# Patient Record
Sex: Male | Born: 1995 | Race: White | Hispanic: No | Marital: Single | Smoking: Former smoker
Health system: Southern US, Community
[De-identification: ages and names within clinical notes are randomized; demographics above are authoritative.]

## PROBLEM LIST (undated history)

## (undated) DIAGNOSIS — R51 Headache: Secondary | ICD-10-CM

## (undated) DIAGNOSIS — S060X9A Concussion with loss of consciousness of unspecified duration, initial encounter: Secondary | ICD-10-CM

## (undated) DIAGNOSIS — S62308A Unspecified fracture of other metacarpal bone, initial encounter for closed fracture: Secondary | ICD-10-CM

## (undated) DIAGNOSIS — B2790 Infectious mononucleosis, unspecified without complication: Secondary | ICD-10-CM

## (undated) DIAGNOSIS — S060XAA Concussion with loss of consciousness status unknown, initial encounter: Secondary | ICD-10-CM

## (undated) HISTORY — DX: Concussion with loss of consciousness of unspecified duration, initial encounter: S06.0X9A

## (undated) HISTORY — DX: Infectious mononucleosis, unspecified without complication: B27.90

## (undated) HISTORY — DX: Unspecified fracture of other metacarpal bone, initial encounter for closed fracture: S62.308A

## (undated) HISTORY — DX: Concussion with loss of consciousness status unknown, initial encounter: S06.0XAA

## (undated) HISTORY — DX: Headache: R51

---
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
## 2013-02-18 ENCOUNTER — Encounter (INDEPENDENT_AMBULATORY_CARE_PROVIDER_SITE_OTHER): Payer: Self-pay

## 2013-02-18 ENCOUNTER — Encounter (INDEPENDENT_AMBULATORY_CARE_PROVIDER_SITE_OTHER): Payer: Self-pay | Admitting: General Surgery

## 2013-02-18 ENCOUNTER — Ambulatory Visit (INDEPENDENT_AMBULATORY_CARE_PROVIDER_SITE_OTHER): Payer: No Typology Code available for payment source | Admitting: General Surgery

## 2013-02-18 DIAGNOSIS — L0591 Pilonidal cyst without abscess: Secondary | ICD-10-CM | POA: Insufficient documentation

## 2013-02-18 NOTE — Progress Notes (Signed)
Patient ID: Derrick Bryant, male   DOB: 1995-12-18, 17 y.o.   MRN: [id number]  No chief complaint on file.   [name] is a 17 y.o. male.   HPI  He has a pilonidal cyst that has had intermittent recurrent infections. These have responded to nonoperative management. Because they have been recurrent, he presents to discuss pilonidal cystectomy. He is self-referred.  Past Medical History  Diagnosis Date  . Headache(784.0)   . Infectious mononucleosis June, 2006  . Closed fracture of 5th metacarpal July 2010    Left hand  . Concussion     Past Surgical History  Procedure Laterality Date  . Tonsillectomy and adenoidectomy  August 2006    Family History  Problem Relation Age of Onset  . Cancer Paternal Grandfather     Died in his 92's    Social History History  Substance Use Topics  . Smoking status: Former Games developer  . Smokeless tobacco: Never Used     Comment: Patient tried smoking a cigarette only once and has not done it again.  . Alcohol Use: Yes     Comment: Patient rarely drinks alcohol.    Allergies  Allergen Reactions  . Biaxin [Clarithromycin] Hives  . Sulfa Antibiotics Hives    Current Outpatient Prescriptions  Medication Sig Dispense Refill  . fluticasone (FLONASE) 50 MCG/ACT nasal spray        No current facility-administered medications for this visit.    Review of Systems Review of Systems  Constitutional: Negative.   Respiratory: Negative.   Cardiovascular: Negative.   Gastrointestinal: Negative.   Hematological: Negative.     There were no vitals taken for this visit.  Physical Exam Physical Exam  Constitutional: He appears well-developed and well-nourished. No distress.  HENT:  Head: Normocephalic and atraumatic.  Eyes: EOM are normal. No scleral icterus.  Cardiovascular: Normal rate and regular rhythm.   Pulmonary/Chest: Effort normal and breath sounds normal.  Abdominal: Soft. He exhibits no mass. There is no tenderness.   Musculoskeletal:  3-4 cm soft tissue mass superior gluteal cleft area. Scar is overlying this mass. There is a firm tract extending inferiorly to an open sinus. No erythema. No purulent drainage.    Data Reviewed none  Assessment    Pilonidal cyst with recurrent infections. None infected at the current time.     Plan    Pilonidal cystectomy with wound healing by secondary intention. We have discussed the procedure and the risks. Risks include but not limited to bleeding, infection, wound healing problems, anesthesia, recurrence. We discussed the length of time but to take the wound to heal. He seems to understand all this and agrees to proceed.        Derrick Bryant J 02/18/2013, 10:23 AM

## 2013-02-18 NOTE — Patient Instructions (Signed)
Will have schedulers call your house with the details of the surgery.

## 2013-03-08 ENCOUNTER — Telehealth (INDEPENDENT_AMBULATORY_CARE_PROVIDER_SITE_OTHER): Payer: Self-pay | Admitting: General Surgery

## 2013-03-08 NOTE — Telephone Encounter (Signed)
I spoke with Dr. Lequita Halt about his son Derrick Bryant who is going to have elective surgery tomorrow. He has the flu and so we have decided to cancel the surgery for now until he gets over the flu.

## 2014-09-13 ENCOUNTER — Encounter: Payer: Self-pay | Admitting: General Surgery

## 2014-09-13 NOTE — Progress Notes (Unsigned)
His father, Dr. Homero Fellers Coad called this morning and stated that Derrick Bryant had a recurrent pilonidal abscess that drained a large amount of purulent material yesterday.  He then had a fever of 102 degrees last night but is afebrile this morning.  No redness around the area.  [name] is starting summer school at college in 2 days.  Will start him on Augmentin for one week.  Will anticipation pilonidal cystectomy later this year.

## 2019-03-25 ENCOUNTER — Telehealth: Payer: Self-pay | Admitting: Family

## 2019-03-25 DIAGNOSIS — Z20822 Contact with and (suspected) exposure to covid-19: Secondary | ICD-10-CM

## 2019-03-25 MED ORDER — BENZONATATE 100 MG PO CAPS: 100.0000 mg | ORAL_CAPSULE | Freq: Three times a day (TID) | ORAL | 0 refills | Status: AC | PRN

## 2019-03-25 NOTE — Progress Notes (Signed)
E-Visit for Corona Virus Screening   Your current symptoms could be consistent with the coronavirus.  Many health care providers can now test patients at their office but not all are.  [facility] has multiple testing sites. For information on our COVID testing locations and hours go to [email or web address]  We are enrolling you in our MyChart Home Monitoring for COVID19 . Daily you will receive a questionnaire within the MyChart website. Our COVID 19 response team will be monitoring your responses daily.  Testing Information: The COVID-19 Community Testing sites will begin testing BY APPOINTMENT ONLY.  You can schedule online at [email or web address]  If you do not have access to a smart phone or computer you may call [phone] for an appointment.  Testing Locations: Appointment schedule is 8 am to 3:30 pm at all sites  [facility] indoors at [address], [place] [address] [facility]  indoors at [address] Vis[address]trance, [place], [place] [address] [place] indoors at [address], [place] [address]  Additional testing sites in the Community:  . For [facility] Testing sites in [place]  https://www.cvs.com/minuteclinic/covid-19-testing  . For Pop-up testing sites in [place]  https://covid19.ncdhhs.gov/about-covid-19/testing/find-my-testing-place/pop-testing-sites  . For Testing sites with regular hours https://onsms.org/[place]/  . For [facility] [place] [facility] https://[facility].com/covid-19-community-outreach-testing/  . For [facility] https://www.[place].gov[place]/human-services/health-department/coronavirus-covid-19-info/covid-19-testing  . For [place] testing in [place] and [place] https://www.[place].gov/[place]/human-services/health-department/coronavirus-covid-19-info/covid-19-testing  . For [facility] testing in [place]   https://[facility]hi.care/covidtesting  For  more  information about community testing call [phone]   We are enrolling you in our MyChart Home Monitoring for COVID19 . Daily you will receive a questionnaire within the MyChart website. Our COVID 19 response team will be monitoring your responses daily.  Please quarantine yourself while awaiting your test results. If you develop fever/cough/breathlessness, please stay home for 10 days with improving symptoms and until you have had 24 hours of no fever (without taking a fever reducer).  You should wear a mask or cloth face covering over your nose and mouth if you must be around other people or animals, including pets (even at home). Try to stay at least 6 feet away from other people. This will protect the people around you.  Please continue good preventive care measures, including:  frequent hand-washing, avoid touching your face, cover coughs/sneezes, stay out of crowds and keep a 6 foot distance from others.  COVID-19 is a respiratory illness with symptoms that are similar to the flu. Symptoms are typically mild to moderate, but there have been cases of severe illness and death due to the virus.   The following symptoms may appear 2-14 days after exposure: . Fever . Cough . Shortness of breath or difficulty breathing . Chills . Repeated shaking with chills . Muscle pain . Headache . Sore throat . New loss of taste or smell . Fatigue . Congestion or runny nose . Nausea or vomiting . Diarrhea  Go to the nearest hospital ED for assessment if fever/cough/breathlessness are severe or illness seems like a threat to life.  It is vitally important that if you feel that you have an infection such as this virus or any other virus that you stay home and away from places where you may spread it to others.  You should avoid contact with people age 65 and older.   You can use medication such as A prescription cough medication called Tessalon Perles 100 mg. You may take 1-2 capsules every 8 hours as    needed for cough.  You may also take acetaminophen (Tylenol) as needed for fever.  Reduce your risk of any infection by using the same precautions used for avoiding the common cold or flu:  . Wash your hands often with soap and warm water for at least 20 seconds.  If soap and water are not readily available, use an alcohol-based hand sanitizer with at least 60% alcohol.  . If coughing or sneezing, cover your mouth and nose by coughing or sneezing into the elbow areas of your shirt or coat, into a tissue or into your sleeve (not your hands). . Avoid shaking hands with others and consider head nods or verbal greetings only. . Avoid touching your eyes, nose, or mouth with unwashed hands.  . Avoid close contact with people who are sick. . Avoid places or events with large numbers of people in one location, like concerts or sporting events. . Carefully consider travel plans you have or are making. . If you are planning any travel outside or inside the [place], visit the CDC's Travelers' Health webpage for the latest health notices. . If you have some symptoms but not all symptoms, continue to monitor at home and seek medical attention if your symptoms worsen. . If you are having a medical emergency, call 911.  HOME CARE . Only take medications as instructed by your medical team. . Drink plenty of fluids and get plenty of rest. . A steam or ultrasonic humidifier can help if you have congestion.   GET HELP RIGHT AWAY IF YOU HAVE EMERGENCY WARNING SIGNS** FOR COVID-19. If you or someone is showing any of these signs seek emergency medical care immediately. Call 911 or proceed to your closest emergency facility if: . You develop worsening high fever. . Trouble breathing . Bluish lips or face . Persistent pain or pressure in the chest . New confusion . Inability to wake or stay awake . You cough up blood. . Your symptoms become more severe  **This list is not all possible symptoms. Contact your  medical provider for any symptoms that are sever or concerning to you.  MAKE SURE YOU   Understand these instructions.  Will watch your condition.  Will get help right away if you are not doing well or get worse.  Your e-visit answers were reviewed by a board certified advanced clinical practitioner to complete your personal care plan.  Depending on the condition, your plan could have included both over the counter or prescription medications.  If there is a problem please reply once you have received a response from your provider.  Your safety is important to us.  If you have drug allergies check your prescription carefully.    You can use MyChart to ask questions about today's visit, request a non-urgent call back, or ask for a work or school excuse for 24 hours related to this e-Visit. If it has been greater than 24 hours you will need to follow up with your provider, or enter a new e-Visit to address those concerns. You will get an e-mail in the next two days asking about your experience.  I hope that your e-visit has been valuable and will speed your recovery. Thank you for using e-visits.  Approximately 5 minutes was spent documenting and reviewing patient's chart.    

## 2019-03-26 ENCOUNTER — Other Ambulatory Visit: Payer: Self-pay

## 2019-03-26 ENCOUNTER — Encounter (HOSPITAL_COMMUNITY): Payer: Self-pay

## 2019-03-26 ENCOUNTER — Ambulatory Visit (HOSPITAL_COMMUNITY)
Admission: EM | Admit: 2019-03-26 | Discharge: 2019-03-26 | Disposition: A | Discharge status: Home / Self Care | Payer: BC Managed Care – PPO | Attending: Family Medicine | Admitting: Family Medicine

## 2019-03-26 DIAGNOSIS — U071 COVID-19: Secondary | ICD-10-CM | POA: Insufficient documentation

## 2019-03-26 DIAGNOSIS — Z20828 Contact with and (suspected) exposure to other viral communicable diseases: Secondary | ICD-10-CM | POA: Diagnosis present

## 2019-03-26 DIAGNOSIS — R52 Pain, unspecified: Secondary | ICD-10-CM

## 2019-03-26 DIAGNOSIS — Z20822 Contact with and (suspected) exposure to covid-19: Secondary | ICD-10-CM

## 2019-03-26 DIAGNOSIS — J029 Acute pharyngitis, unspecified: Secondary | ICD-10-CM

## 2019-03-26 DIAGNOSIS — B349 Viral infection, unspecified: Secondary | ICD-10-CM | POA: Diagnosis present

## 2019-03-26 DIAGNOSIS — R5383 Other fatigue: Secondary | ICD-10-CM

## 2019-03-26 LAB — POCT RAPID STREP A: Streptococcus, Group A Screen (Direct): NEGATIVE

## 2019-03-26 MED ORDER — CETIRIZINE HCL 10 MG PO CAPS: 10.0000 mg | ORAL_CAPSULE | Freq: Every day | ORAL | 0 refills | Status: AC

## 2019-03-26 NOTE — ED Triage Notes (Addendum)
Pt. States he has has body aches, sore throat, fatigue for 2 days now. Pt. Informed me of having an evisit & already been prescribed meds but since wants strep/COVID testing today.

## 2019-03-26 NOTE — Discharge Instructions (Signed)
Sore Throat  Your rapid strep tested Negative today.  Covid pending.  Please continue to quarantine until results return.  If positive quarantine until 10 days from symptom onset, symptoms improving and fever free.  Please continue Tylenol or Ibuprofen for fever and pain. May try salt water gargles, cepacol lozenges, throat spray, or OTC cold relief medicine for throat discomfort. If you also have congestion take a daily anti-histamine like Zyrtec, Claritin, and a oral decongestant to help with post nasal drip that may be irritating your throat.   Stay hydrated and drink plenty of fluids to keep your throat coated relieve irritation.

## 2019-03-27 LAB — NOVEL CORONAVIRUS, NAA (HOSP ORDER, SEND-OUT TO REF LAB; TAT 18-24 HRS): SARS-CoV-2, NAA: DETECTED — AB

## 2019-03-28 LAB — CULTURE, GROUP A STREP (THRC)

## 2019-03-28 NOTE — ED Provider Notes (Signed)
[facility]    CSN: [id number] Arrival date & time: 03/26/19  1617      History   Chief Complaint Chief Complaint  Patient presents with  . Sore Throat    [name] is a 24 y.o. male history of previous tonsillectomy presenting today for evaluation of sore throat and URI symptoms.  Patient symptoms began Sunday, approximately 5 days ago.  He has had body aches, fatigue.  He has had a persistent sore throat.  He notes that his father as well as his girlfriend have recently tested positive for Covid.  He was tested on Saturday and had a test that was negative.  His symptoms began the next day.  He denies any fevers.  Denies chest pain or shortness of breath.  Has not used any medicines for symptoms.  HPI  Past Medical History:  Diagnosis Date  . Closed fracture of 5th metacarpal July 2010   Left hand  . Concussion   . Headache(784.0)   . Infectious mononucleosis June, 2006    Patient Active Problem List   Diagnosis Date Noted  . 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 TONSILLECTOMY AND ADENOIDECTOMY  August 2006       Home Medications    Prior to Admission medications   Medication Sig Start Date End Date Taking? Authorizing Provider  amoxicillin-clavulanate (AUGMENTIN) 875-125 MG per tablet Take 1 tablet by mouth 2 (two) times daily.    [provider]  benzonatate (TESSALON PERLES) 100 MG capsule Take 1 capsule (100 mg total) by mouth 3 (three) times daily as needed. 03/25/19   Jannifer Rodney A, FNP  Cetirizine HCl 10 MG CAPS Take 1 capsule (10 mg total) by mouth daily. 03/26/19   Roniya Tetro, Junius Creamer, PA-C  fluticasone (FLONASE) 50 MCG/ACT nasal spray  06/24/12   [provider]    Family History Family History  Problem Relation Age of Onset  . Cancer Paternal Grandfather        Died in his 52's  . Healthy Mother   . Healthy Father     Social History Social History    Tobacco Use  . Smoking status: Former Games developer  . Smokeless tobacco: Never Used  . Tobacco comment: Patient tried smoking a cigarette only once and has not done it again.  Substance Use Topics  . Alcohol use: Yes    Comment: Patient rarely drinks alcohol.  . Drug use: No     Allergies   Biaxin [clarithromycin] and Sulfa antibiotics   Review of Systems Review of Systems  Constitutional: Positive for fatigue. Negative for activity change, appetite change, chills and fever.  HENT: Positive for congestion, rhinorrhea and sore throat. Negative for ear pain, sinus pressure and trouble swallowing.   Eyes: Negative for discharge and redness.  Respiratory: Positive for cough. Negative for chest tightness and shortness of breath.   Cardiovascular: Negative for chest pain.  Gastrointestinal: Negative for abdominal pain, diarrhea, nausea and vomiting.  Musculoskeletal: Positive for myalgias.  Skin: Negative for rash.  Neurological: Negative for dizziness, light-headedness and headaches.     Physical Exam Triage Vital Signs ED Triage Vitals  Enc Vitals Group     BP 03/26/19 1642 124/69     Pulse Rate 03/26/19 1642 60     Resp 03/26/19 1642 17     Temp 03/26/19 1642 99 F (37.2 C)     Temp Source 03/26/19 1642 Oral  SpO2 03/26/19 1642 99 %     Weight 03/26/19 1640 160 lb (72.6 kg)     Height --      Head Circumference --      Peak Flow --      Pain Score 03/26/19 1640 4     Pain Loc --      Pain Edu? --      Excl. in [place]? --    No data found.  Updated Vital Signs BP 124/69 (BP Location: Left Arm)   Pulse 60   Temp 99 F (37.2 C) (Oral)   Resp 17   Wt 160 lb (72.6 kg)   SpO2 99%   BMI 27.04 kg/m   Visual Acuity Right Eye Distance:   Left Eye Distance:   Bilateral Distance:    Right Eye Near:   Left Eye Near:    Bilateral Near:     Physical Exam Vitals and nursing note reviewed.  Constitutional:      Appearance: He is well-developed.  HENT:     Head:  Normocephalic and atraumatic.     Ears:     Comments: Bilateral ears without tenderness to palpation of external auricle, tragus and mastoid, EAC's without erythema or swelling, TM's with good bony landmarks and cone of light. Non erythematous.     Mouth/Throat:     Comments: Oral mucosa pink and moist, no tonsillar enlargement or exudate. Posterior pharynx patent and nonerythematous, no uvula deviation or swelling. Normal phonation.  Eyes:     Conjunctiva/sclera: Conjunctivae normal.  Cardiovascular:     Rate and Rhythm: Normal rate and regular rhythm.     Heart sounds: No murmur.  Pulmonary:     Effort: Pulmonary effort is normal. No respiratory distress.     Breath sounds: Normal breath sounds.     Comments: Breathing comfortably at rest, CTABL, no wheezing, rales or other adventitious sounds auscultated Abdominal:     Palpations: Abdomen is soft.     Tenderness: There is no abdominal tenderness.  Musculoskeletal:     Cervical back: Neck supple.  Skin:    General: Skin is warm and dry.  Neurological:     Mental Status: He is alert.      UC Treatments / Results  Labs (all labs ordered are listed, but only abnormal results are displayed) Labs Reviewed  NOVEL CORONAVIRUS, NAA (HOSP ORDER, SEND-OUT TO REF LAB; TAT 18-24 HRS) - Abnormal; Notable for the following components:      Result Value   SARS-CoV-2, NAA DETECTED (*)    All other components within normal limits  CULTURE, GROUP A STREP [facility])  POCT RAPID STREP A    EKG   Radiology No results found.  Procedures Procedures (including critical care time)  Medications Ordered in UC Medications - No data to display  Initial Impression / Assessment and Plan / UC Course  I have reviewed the triage vital signs and the nursing notes.  Pertinent labs & imaging results that were available during my care of the patient were reviewed by me and considered in my medical decision making (see chart for details).      Patient with URI symptoms and sore throat after Covid exposure.  Initial Covid test negative, repeating today.  Still feel Covid highly suspicious given to close exposures.  Strep test negative.  Most likely secondary to drainage, recommending to initiate daily cetirizine.  Continue Tylenol ibuprofen, rest and push fluids.  Discussed strict return precautions. Patient verbalized understanding and is  agreeable with plan.  Final Clinical Impressions(s) / UC Diagnoses   Final diagnoses:  Exposure to COVID-19 virus  Viral illness     Discharge Instructions     Sore Throat  Your rapid strep tested Negative today.  Covid pending.  Please continue to quarantine until results return.  If positive quarantine until 10 days from symptom onset, symptoms improving and fever free.  Please continue Tylenol or Ibuprofen for fever and pain. May try salt water gargles, cepacol lozenges, throat spray, or OTC cold relief medicine for throat discomfort. If you also have congestion take a daily anti-histamine like Zyrtec, Claritin, and a oral decongestant to help with post nasal drip that may be irritating your throat.   Stay hydrated and drink plenty of fluids to keep your throat coated relieve irritation.    ED Prescriptions    Medication Sig Dispense Auth. Provider   Cetirizine HCl 10 MG CAPS Take 1 capsule (10 mg total) by mouth daily. 15 capsule Kayton Dunaj, Roscoe C, PA-C     PDMP not reviewed this encounter.   Lew Dawes, [place] 03/28/19 1336

## 2019-03-30 ENCOUNTER — Telehealth (HOSPITAL_COMMUNITY): Payer: Self-pay | Admitting: Emergency Medicine

## 2019-03-30 NOTE — Telephone Encounter (Signed)
